# Patient Record
Sex: Female | Born: 1970 | Race: Black or African American | Hispanic: No | Marital: Single | Smoking: Never smoker
Health system: Southern US, Community
[De-identification: ages and names within clinical notes are randomized; demographics above are authoritative.]

---

## 2006-01-16 ENCOUNTER — Emergency Department: Payer: Self-pay | Admitting: General Practice

## 2006-01-21 ENCOUNTER — Emergency Department: Payer: Self-pay | Admitting: Emergency Medicine

## 2007-03-31 ENCOUNTER — Emergency Department: Payer: Self-pay | Admitting: Emergency Medicine

## 2009-04-02 ENCOUNTER — Emergency Department: Payer: Self-pay | Admitting: Emergency Medicine

## 2009-08-02 ENCOUNTER — Emergency Department: Payer: Self-pay | Admitting: Emergency Medicine

## 2009-09-15 ENCOUNTER — Emergency Department: Payer: Self-pay | Admitting: Internal Medicine

## 2010-01-12 ENCOUNTER — Emergency Department: Payer: Self-pay | Admitting: Emergency Medicine

## 2010-01-20 ENCOUNTER — Emergency Department: Payer: Self-pay | Admitting: Emergency Medicine

## 2010-07-05 ENCOUNTER — Emergency Department: Payer: Self-pay | Admitting: Emergency Medicine

## 2011-03-11 ENCOUNTER — Emergency Department: Payer: Self-pay | Admitting: Internal Medicine

## 2011-04-16 ENCOUNTER — Emergency Department: Payer: Self-pay | Admitting: Internal Medicine

## 2011-08-30 ENCOUNTER — Emergency Department: Payer: Self-pay | Admitting: Emergency Medicine

## 2012-01-01 ENCOUNTER — Inpatient Hospital Stay: Payer: Self-pay | Admitting: Obstetrics & Gynecology

## 2012-06-30 ENCOUNTER — Emergency Department: Payer: Self-pay | Admitting: Emergency Medicine

## 2012-06-30 LAB — URINALYSIS, COMPLETE
Bilirubin,UR: NEGATIVE
Blood: NEGATIVE
Glucose,UR: NEGATIVE mg/dL (ref 0–75)
Ketone: NEGATIVE
Leukocyte Esterase: NEGATIVE
Nitrite: NEGATIVE
Ph: 6 (ref 4.5–8.0)
Squamous Epithelial: <1
WBC UR: 1 /HPF (ref 0–5)

## 2012-06-30 LAB — WET PREP, GENITAL

## 2012-08-18 ENCOUNTER — Emergency Department: Payer: Self-pay | Admitting: Emergency Medicine

## 2012-08-18 LAB — URINALYSIS, COMPLETE
Bacteria: NONE SEEN
Bilirubin,UR: NEGATIVE
Nitrite: NEGATIVE
Protein: NEGATIVE
RBC,UR: 1 /HPF (ref 0–5)
WBC UR: 1 /HPF (ref 0–5)

## 2012-08-18 LAB — COMPREHENSIVE METABOLIC PANEL
Albumin: 4.5 g/dL (ref 3.4–5.0)
Alkaline Phosphatase: 74 U/L (ref 50–136)
Anion Gap: 7 (ref 7–16)
BUN: 8 mg/dL (ref 7–18)
Calcium, Total: 9.6 mg/dL (ref 8.5–10.1)
Chloride: 106 mmol/L (ref 98–107)
Co2: 27 mmol/L (ref 21–32)
Creatinine: 0.9 mg/dL (ref 0.60–1.30)
EGFR (African American): >60
EGFR (Non-African Amer.): >60
Osmolality: 277 (ref 275–301)
Potassium: 3.7 mmol/L (ref 3.5–5.1)
SGOT(AST): 24 U/L (ref 15–37)
Sodium: 140 mmol/L (ref 136–145)
Total Protein: 9 g/dL — ABNORMAL HIGH (ref 6.4–8.2)

## 2012-08-18 LAB — CBC
HCT: 37.8 % (ref 35.0–47.0)
MCV: 99 fL (ref 80–100)
WBC: 3.8 10*3/uL (ref 3.6–11.0)

## 2012-08-18 LAB — WET PREP, GENITAL

## 2013-01-21 ENCOUNTER — Emergency Department: Payer: Self-pay | Admitting: Emergency Medicine

## 2013-01-21 LAB — WET PREP, GENITAL

## 2013-01-21 LAB — GC/CHLAMYDIA PROBE AMP

## 2013-11-11 IMAGING — CT CT HEAD WITHOUT CONTRAST
2 series · 16 of 30 positions shown, 20 images · non-contrast
Comparison: none

REASON FOR EXAM: abrupt onset of worse HA of life
COMMENTS:   LMP: 2 days ago

PROCEDURE:     CT  - CT HEAD WITHOUT CONTRAST  - April 16, 2011  [DATE]
RESULT:     Comparison:  None
TECHNIQUE: Multiple axial images from the foramen magnum to the vertex were
obtained without IV contrast.

[Series 2: without · axial · non-contrast · 0.37mm/px · z∈[+438,+558]mm · 13 of 29 slices shown, 17 images]
[im 3/29  brain]
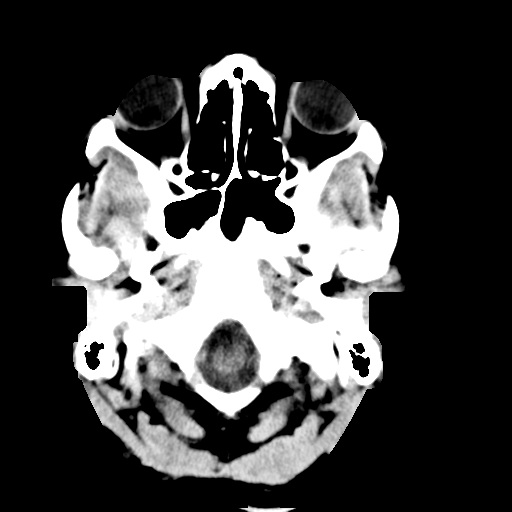
[im 3/29  bone]
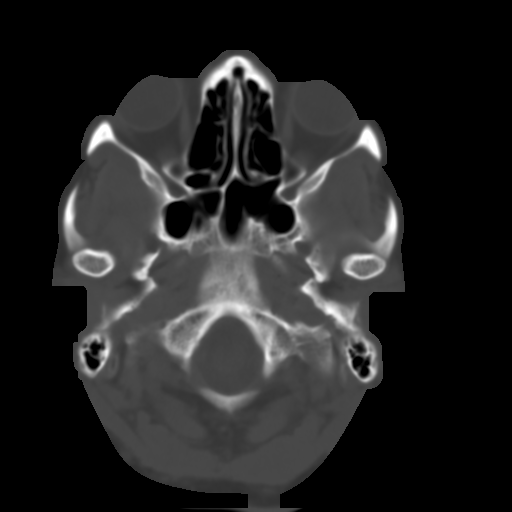
[im 5/29  brain]
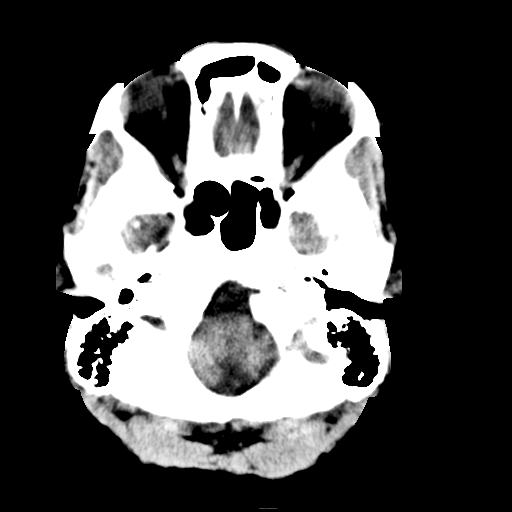
[im 7/29  brain]
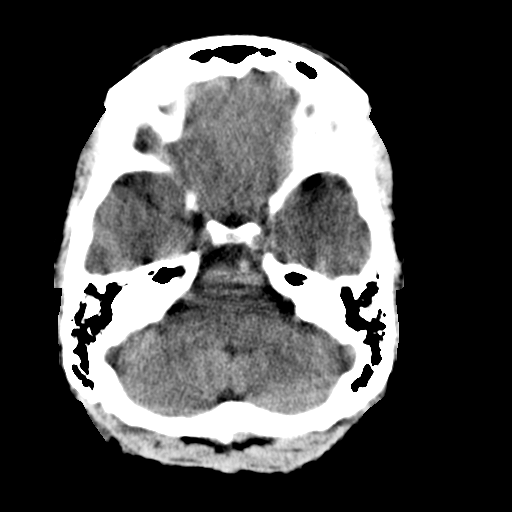
[im 9/29  brain]
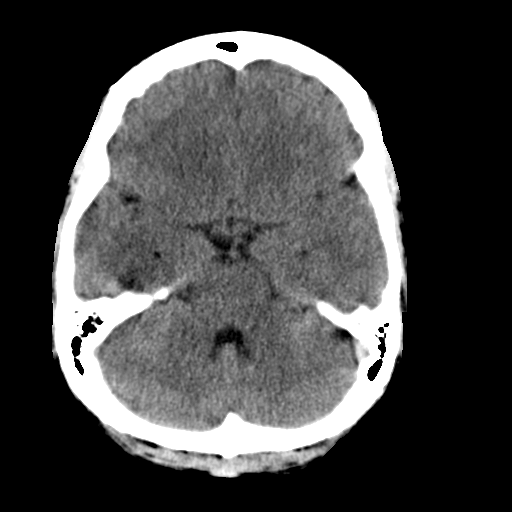
[im 11/29  brain]
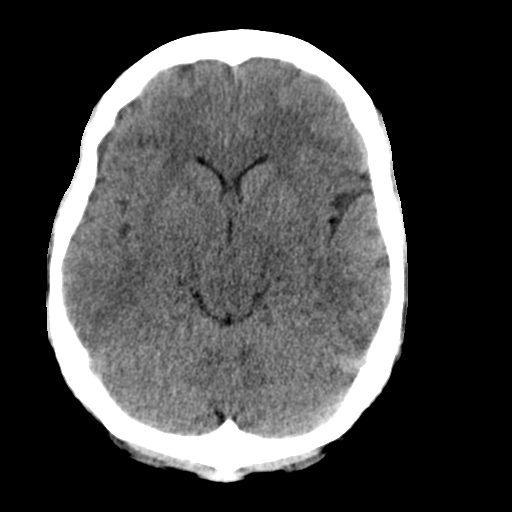
[im 11/29  bone]
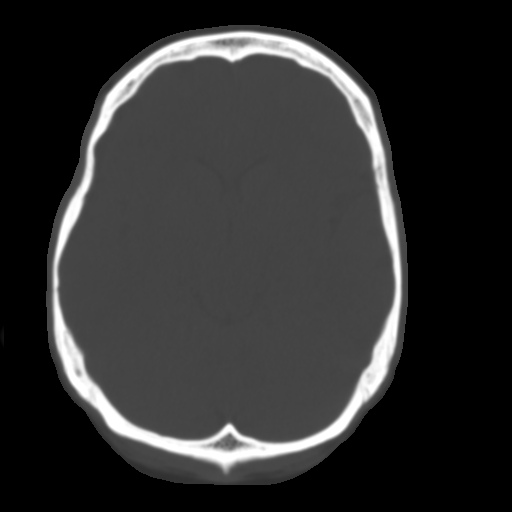
[im 13/29  brain]
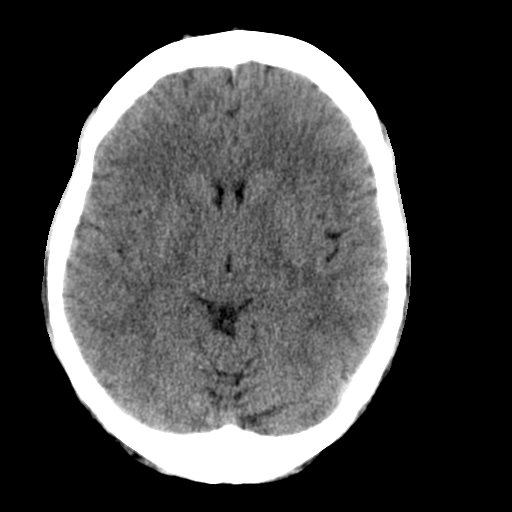
[im 15/29  brain]
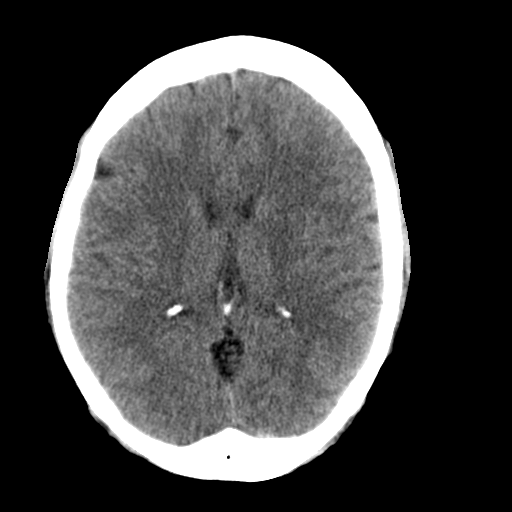
[im 17/29  brain]
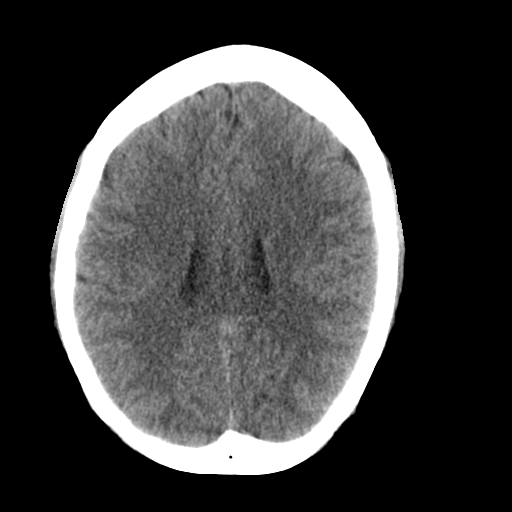
[im 19/29  brain]
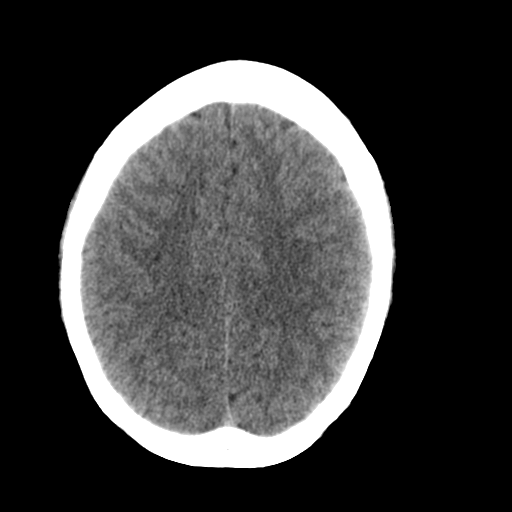
[im 19/29  bone]
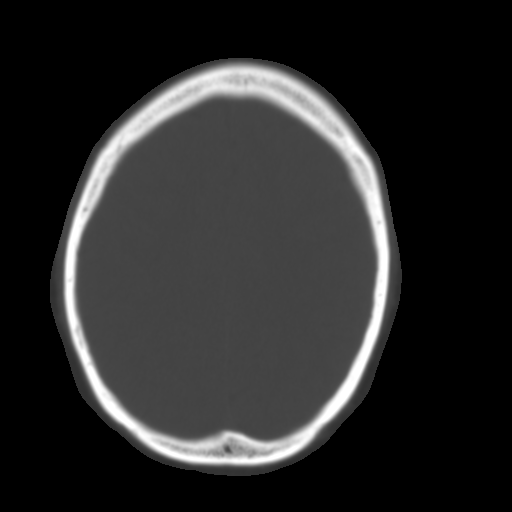
[im 21/29  brain]
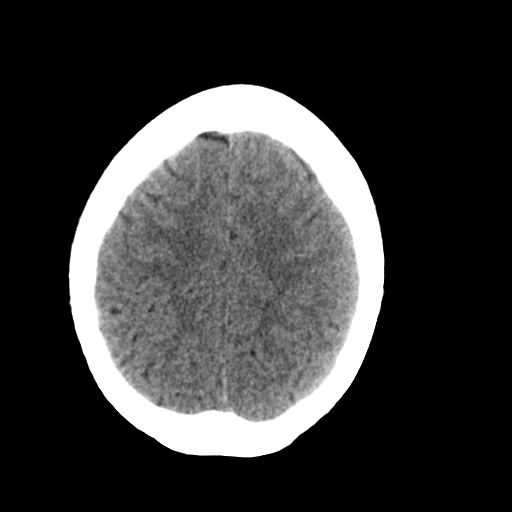
[im 23/29  brain]
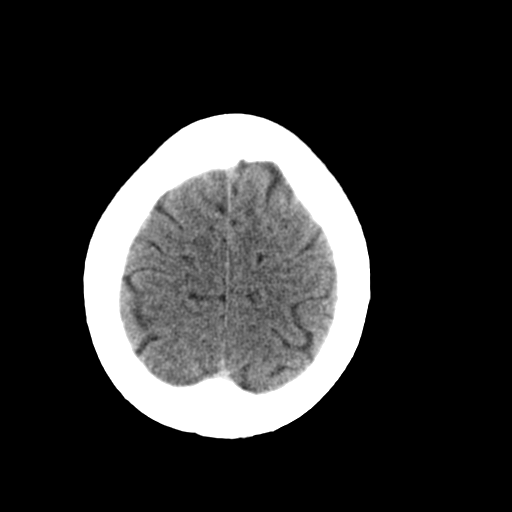
[im 25/29  brain]
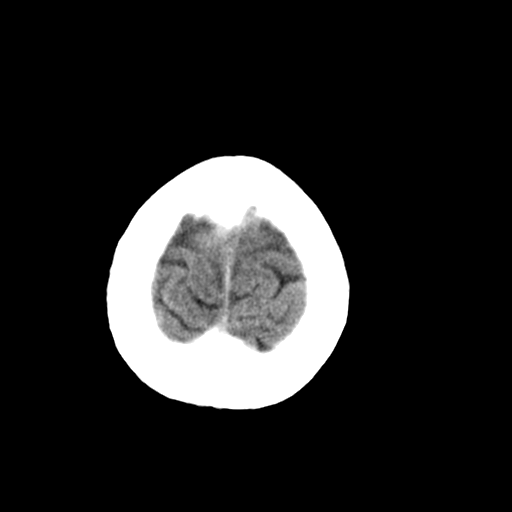
[im 27/29  brain]
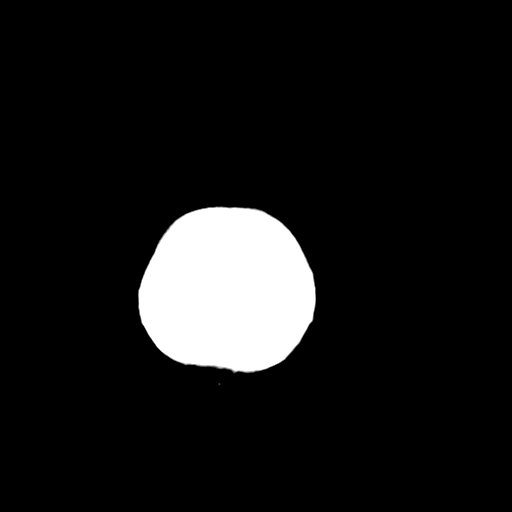
[im 27/29  bone]
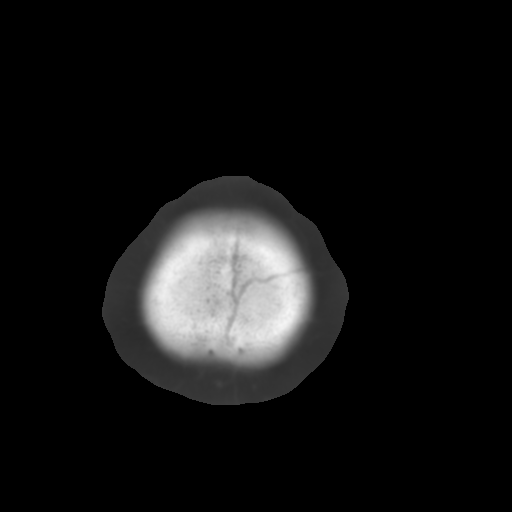

[Series 3: bone · axial · 0.37mm/px · z∈[+438,+478]mm · 3 of 29 slices shown]
[im 3/29  bone]
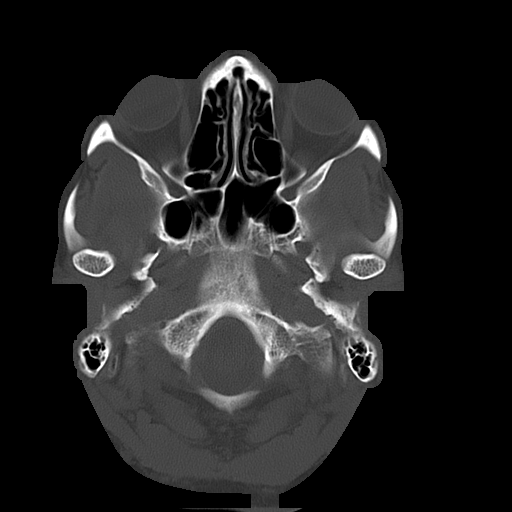
[im 7/29  bone]
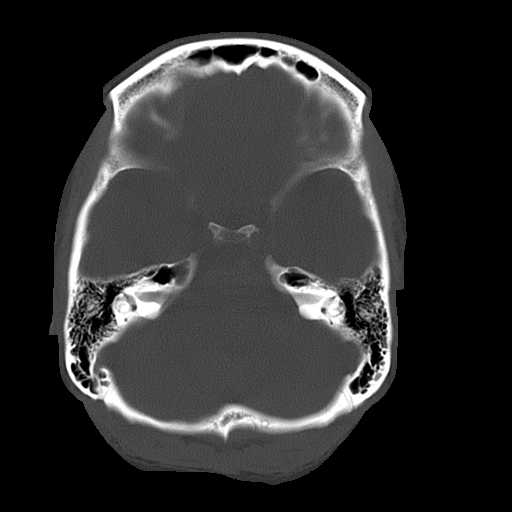
[im 11/29  bone]
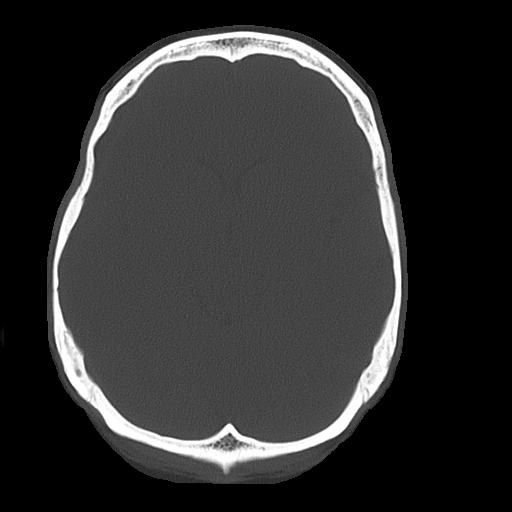

[16 of 30 positions shown; findings below may reference images not displayed]

FINDINGS: There is no evidence for mass effect, midline shift, or extra-axial fluid
collections. There is no evidence for space-occupying lesion, intracranial
hemorrhage, or cortical-based area of infarction.

The osseous structures are unremarkable.
IMPRESSION: No acute intracranial process.

## 2014-04-04 ENCOUNTER — Emergency Department: Payer: Self-pay | Admitting: Emergency Medicine

## 2014-08-12 NOTE — H&P (Signed)
L&D Evaluation:  History Expanded:   HPI 44 yo [id number], EDD of 02/27/12 per pt's report (records not available). Presents at 32 weeks with c/o leaking since yesterday am around 0100. Pt also reports some abdominal pain and spotting. [facility]. History notable for +HIV status and prior C-section x 3, most recent delivery was 12 years ago.    Blood Type (Maternal) Unknown    Group B Strep Results Maternal (Result >5wks must be treated as unknown) unknown/result > 5 weeks ago    Maternal HIV Positive    Maternal Syphilis Ab Unknown    Maternal Varicella Unknown    Rubella Results (Maternal) unknown    Maternal T-Dap Unknown    Patient's Medical History Asthma  +HIV    Patient's Surgical History Previous C-Section  x3    Medications Pre Natal Vitamins  HIV medications     Allergies PCN    Social History none     Family History Non-Contributory    ROS:   ROS see HPI   Exam:   Vital Signs stable     General no apparent distress    Mental Status clear    Chest clear     Heart no murmur/gallop/rubs    Abdomen gravid, tender with contractions    Edema no edema    Pelvic no external lesions, 2/75/-2    Mebranes Ruptured, equivocal nitrizine, +pooling, + fern    Description clear    FHT normal rate with no decels, appropriate for gestational age    Ucx irregular    Skin dry   Impression:   Impression PPROM, + HIV   Plan:   Comments Discussed with Dr Tiburcio PeaHarris, given +HIV status, [facility] Will start IV and [facility].   Electronic Signatures: Angelisa Winthrop, Marta Lamasamara K (CNM)  (Signed 29-Sep-13 12:15)  Authored: L&D Evaluation   Last Updated: 29-Sep-13 12:15 by Vella KohlerBrothers, Adama Ivins K (CNM)

## 2019-11-24 ENCOUNTER — Emergency Department
Admission: EM | Admit: 2019-11-24 | Discharge: 2019-11-24 | Disposition: A | Discharge status: Home / Self Care | Payer: Medicaid Other | Attending: Emergency Medicine | Admitting: Emergency Medicine

## 2019-11-24 ENCOUNTER — Other Ambulatory Visit: Payer: Self-pay

## 2019-11-24 DIAGNOSIS — M546 Pain in thoracic spine: Secondary | ICD-10-CM

## 2019-11-24 DIAGNOSIS — R109 Unspecified abdominal pain: Secondary | ICD-10-CM | POA: Diagnosis present

## 2019-11-24 LAB — URINALYSIS, COMPLETE (UACMP) WITH MICROSCOPIC
Bacteria, UA: NONE SEEN
Bilirubin Urine: NEGATIVE
Glucose, UA: NEGATIVE mg/dL
Hgb urine dipstick: NEGATIVE
Ketones, ur: NEGATIVE mg/dL
Leukocytes,Ua: NEGATIVE
Nitrite: NEGATIVE
Protein, ur: NEGATIVE mg/dL
Specific Gravity, Urine: 1.03 (ref 1.005–1.030)
pH: 5 (ref 5.0–8.0)

## 2019-11-24 NOTE — ED Provider Notes (Signed)
Emergency Department Provider Note  ____________________________________________  Time seen: Approximately 4:48 PM  I have reviewed the triage vital signs and the nursing notes.   HISTORY  Chief Complaint Back Pain   Historian Patietn    HPI Dana Clayton is a 49 y.o. female presents to the emergency department after patient felt an intense, burning pain along the left flank.  Patient states the pain lasted several seconds and then resolved.  Patient states that pain has not returned.  Patient states that she has not experienced similar pain in the past and wanted to be evaluated. She denies falls or mechanisms of trauma.  No dysuria, hematuria or increased urinary frequency.  She has been afebrile at home.  She has not noticed a rash on the left flank.  She denies cough or recent illness she denies chest pain, chest tightness, shortness of breath or abdominal pain.   History reviewed. No pertinent past medical history.   Immunizations up to date:  Yes.     History reviewed. No pertinent past medical history.  There are no problems to display for this patient.    Prior to Admission medications   Not on File    Allergies Penicillins  History reviewed. No pertinent family history.  Social History Social History   Tobacco Use  . Smoking status: Not on file  Substance Use Topics  . Alcohol use: Not on file  . Drug use: Not on file     Review of Systems  Constitutional: No fever/chills Eyes:  No discharge ENT: No upper respiratory complaints. Respiratory: no cough. No SOB/ use of accessory muscles to breath Gastrointestinal:   No nausea, no vomiting.  No diarrhea.  No constipation. Musculoskeletal: Negative for musculoskeletal pain. Skin: Negative for rash, abrasions, lacerations, ecchymosis.    ____________________________________________   PHYSICAL EXAM:  VITAL SIGNS: ED Triage Vitals  Enc Vitals Group     BP 11/24/19 1429 (!) 149/85      Pulse Rate 11/24/19 1429 81     Resp 11/24/19 1429 18     Temp 11/24/19 1429 98.7 F (37.1 C)     Temp Source 11/24/19 1429 Oral     SpO2 11/24/19 1429 99 %     Weight 11/24/19 1430 191 lb (86.6 kg)     Height 11/24/19 1430 5\' 3"  (1.6 m)     Head Circumference --      Peak Flow --      Pain Score 11/24/19 1430 7     Pain Loc --      Pain Edu? --      Excl. in GC? --      Constitutional: Alert and oriented. Well appearing and in no acute distress. Eyes: Conjunctivae are normal. PERRL. EOMI. Head: Atraumatic. ENT:      Ears:       Nose: No congestion/rhinnorhea.      Mouth/Throat: Mucous membranes are moist.  Neck: No stridor.  No cervical spine tenderness to palpation.  Cardiovascular: Normal rate, regular rhythm. Normal S1 and S2.  Good peripheral circulation. Respiratory: Normal respiratory effort without tachypnea or retractions. Lungs CTAB. Good air entry to the bases with no decreased or absent breath sounds Gastrointestinal: Bowel sounds x 4 quadrants. Soft and nontender to palpation. No guarding or rigidity. No distention. Musculoskeletal: Full range of motion to all extremities. No obvious deformities noted Neurologic:  Normal for age. No gross focal neurologic deficits are appreciated.  Skin:  Skin is warm, dry and intact. No rash  noted. Psychiatric: Mood and affect are normal for age. Speech and behavior are normal.   ____________________________________________   LABS (all labs ordered are listed, but only abnormal results are displayed)  Labs Reviewed  URINALYSIS, COMPLETE (UACMP) WITH MICROSCOPIC - Abnormal; Notable for the following components:      Result Value   Color, Urine YELLOW (*)    APPearance CLEAR (*)    All other components within normal limits   ____________________________________________  EKG   ____________________________________________  RADIOLOGY   No results  found.  ____________________________________________    PROCEDURES  Procedure(s) performed:     Procedures     Medications - No data to display   ____________________________________________   INITIAL IMPRESSION / ASSESSMENT AND PLAN / ED COURSE  Pertinent labs & imaging results that were available during my care of the patient were reviewed by me and considered in my medical decision making (see chart for details).    Assessment and plan Flank pain 49 year old female presents to the emergency department with a intense burning flank pain that occurred momentarily and then resolved.  Patient was mildly hypertensive at triage but vital signs were otherwise reassuring.  On physical exam, patient had no paraspinal muscle tenderness that was reproduced and no CVA tenderness.  We will obtain urinalysis and will reassess.  Patient has declined pain medicine in the emergency department stating that she is currently completely asymptomatic at this time.  Urinalysis was reassuring without signs to indicate nephrolithiasis, pyelonephritis or cystitis.  Patient remains comfortable and states that she has not experienced pain while in the emergency department.  She is comfortable being discharged without prescribed pain medicines.   ____________________________________________  FINAL CLINICAL IMPRESSION(S) / ED DIAGNOSES  Final diagnoses:  Acute thoracic back pain, unspecified back pain laterality      NEW MEDICATIONS STARTED DURING THIS VISIT:  ED Discharge Orders    None          This chart was dictated using voice recognition software/Dragon. Despite best efforts to proofread, errors can occur which can change the meaning. Any change was purely unintentional.     Gasper Lloyd 11/24/19 Toma Deiters, MD 11/24/19 2218

## 2019-11-24 NOTE — ED Notes (Signed)
Patient left before discharge instructions could be discussed or vital signs taken.

## 2019-11-24 NOTE — ED Triage Notes (Signed)
Pt comes POV with pinching feeling in left side of middle back area. Pt was cooking when it started. Unsure if injury.

## 2019-12-15 ENCOUNTER — Emergency Department
Admission: EM | Admit: 2019-12-15 | Discharge: 2019-12-15 | Disposition: A | Discharge status: Home / Self Care | Payer: Medicaid Other | Attending: Emergency Medicine | Admitting: Emergency Medicine

## 2019-12-15 ENCOUNTER — Other Ambulatory Visit: Payer: Self-pay

## 2019-12-15 DIAGNOSIS — L509 Urticaria, unspecified: Secondary | ICD-10-CM | POA: Diagnosis present

## 2019-12-15 DIAGNOSIS — L508 Other urticaria: Secondary | ICD-10-CM

## 2019-12-15 MED ORDER — HYDROXYZINE PAMOATE 25 MG PO CAPS: 25.0000 mg | ORAL_CAPSULE | Freq: Three times a day (TID) | ORAL | 0 refills | Status: AC | PRN

## 2019-12-15 MED ORDER — FAMOTIDINE 20 MG PO TABS
40.0000 mg | ORAL_TABLET | Freq: Once | ORAL | Status: AC
  Administered 2019-12-15: 40 mg via ORAL
  Filled 2019-12-15: qty 2

## 2019-12-15 MED ORDER — FAMOTIDINE 20 MG PO TABS: 20.0000 mg | ORAL_TABLET | Freq: Two times a day (BID) | ORAL | 0 refills | Status: DC

## 2019-12-15 MED ORDER — PREDNISONE 20 MG PO TABS: ORAL_TABLET | ORAL | 0 refills | Status: DC

## 2019-12-15 MED ORDER — DEXAMETHASONE SODIUM PHOSPHATE 10 MG/ML IJ SOLN
10.0000 mg | Freq: Once | INTRAMUSCULAR | Status: AC
  Administered 2019-12-15: 10 mg via INTRAMUSCULAR
  Filled 2019-12-15: qty 1

## 2019-12-15 NOTE — ED Triage Notes (Signed)
Pt to the er for hives over her body. Pt states it started 2 days ago. Pt taking no meds at home to treat. Pt only allergy is to PCN. Pt denies itching. Pt denies any new environmental exposures. No SOB.

## 2019-12-15 NOTE — Discharge Instructions (Addendum)
You are being treated for hives. The exact cause is not known.  Take the prescription meds as prescribed, and follow-up with your primary provider for continued symptoms.  Avoid excessive sweating and overheating.  Also avoid long hot showers.  Return to the ED if necessary.

## 2019-12-15 NOTE — ED Provider Notes (Signed)
[facility] Emergency Department Provider Note ____________________________________________  Time seen: 1625  I have reviewed the triage vital signs and the nursing notes.  HISTORY  Chief Complaint  Urticaria  HPI Dana Clayton is a 49 y.o. female presents herself to the ED for evaluation of  2 days of hives over the body.  Patient denies taking any new medications or any medications to treat her symptoms in the interim.  She only notes an allergy to penicillin.  She denies any difficulty breathing, swallowing, or controlling secretions.  She also denies any known or new environmental exposures.  She denies any cough, congestion, or shortness of breath.  History reviewed. No pertinent past medical history.  There are no problems to display for this patient.  History reviewed. No pertinent surgical history.  Prior to Admission medications   Medication Sig Start Date End Date Taking? Authorizing Provider  famotidine (PEPCID) 20 MG tablet Take 1 tablet (20 mg total) by mouth 2 (two) times daily for 7 days. 12/15/19 12/22/19  Maryclaire Stoecker, Charlesetta Ivory, PA-C  hydrOXYzine (VISTARIL) 25 MG capsule Take 1 capsule (25 mg total) by mouth 3 (three) times daily as needed for up to 10 days for itching. 12/15/19 12/25/19  Edmund Rick, Charlesetta Ivory, PA-C  predniSONE (DELTASONE) 20 MG tablet Take 3 tabs daily x 2 days; Take 2 tabs daily x 2 days; Take 1 tab daily x 2 days; Take 0.5 tabs daily x 2 days 12/15/19   Michale Weikel, Charlesetta Ivory, PA-C    Allergies Penicillins  History reviewed. No pertinent family history.  Social History Social History   Tobacco Use  . Smoking status: Never Smoker  . Smokeless tobacco: Never Used  Substance Use Topics  . Alcohol use: Yes    Comment: rarely  . Drug use: Never    Review of Systems  Constitutional: Negative for fever. Eyes: Negative for visual changes. ENT: Negative for sore throat. Denies mucous membrane swelling Cardiovascular:  Negative for chest pain. Respiratory: Negative for shortness of breath. Gastrointestinal: Negative for abdominal pain, vomiting and diarrhea. Genitourinary: Negative for dysuria. Skin: Positive for rash. Neurological: Negative for headaches, focal weakness or numbness. ____________________________________________  PHYSICAL EXAM:  VITAL SIGNS: ED Triage Vitals  Enc Vitals Group     BP 12/15/19 1357 (!) 126/107     Pulse Rate 12/15/19 1357 (!) 113     Resp 12/15/19 1357 18     Temp 12/15/19 1357 99.2 F (37.3 C)     Temp Source 12/15/19 1357 Oral     SpO2 12/15/19 1357 96 %     Weight 12/15/19 1358 191 lb (86.6 kg)     Height 12/15/19 1358 5\' 3"  (1.6 m)     Head Circumference --      Peak Flow --      Pain Score 12/15/19 1358 0     Pain Loc --      Pain Edu? --      Excl. in GC? --     Constitutional: Alert and oriented. Well appearing and in no distress. Head: Normocephalic and atraumatic. Eyes: Conjunctivae are normal. PERRL. Normal extraocular movements Ears: Canals clear. TMs intact bilaterally. Nose: No congestion/rhinorrhea/epistaxis. Mouth/Throat: Mucous membranes are moist.  Uvula is midline and tonsils are flat.  No oropharyngeal lesions are appreciated. Neck: Supple. No thyromegaly. Hematological/Lymphatic/Immunological: No cervical lymphadenopathy. Cardiovascular: Normal rate, regular rhythm. Normal distal pulses. Respiratory: Normal respiratory effort. No wheezes/rales/rhonchi. Musculoskeletal: Nontender with normal range of motion in all extremities.  Neurologic:  Normal gait without ataxia. Normal speech and language. No gross focal neurologic deficits are appreciated. Skin:  Skin is warm, dry and intact.  Patient with multiple scattered erythematous maculopapular lesions noted to the extremities and torso.  The whelps areas are consistent with urticaria.  No vesicular eruptions, induration, or excoriations are  noted. ____________________________________________  PROCEDURES  Decadron 10 mg IM Famotidine 40 mg p.o.  Procedures ____________________________________________  INITIAL IMPRESSION / ASSESSMENT AND PLAN / ED COURSE  DDX: contact dermatitis, drug rash, zoster, PR  Patient with ED evaluation of 2-day complaint of waxing and waning pruritic whelps to the extremities and torso.  Patient is unclear of any known triggers or contacts.  She also denies any difficulty breathing, swallowing, or controlling secretions.  She is treated empirically for an idiopathic urticaria with steroids and antihistamines in the ED.  Patient is discharged with symptoms slightly improved and with prescriptions to continue with histamine blockade and a steroid taper.  She is advised to follow-up with her primary provider should symptoms persist.  Return precautions have been discussed.  Dana Clayton was evaluated in Emergency Department on 12/15/2019 for the symptoms described in the history of present illness. She was evaluated in the context of the global COVID-19 pandemic, which necessitated consideration that the patient might be at risk for infection with the SARS-CoV-2 virus that causes COVID-19. Institutional protocols and algorithms that pertain to the evaluation of patients at risk for COVID-19 are in a state of rapid change based on information released by regulatory bodies including the CDC and federal and state organizations. These policies and algorithms were followed during the patient's care in the ED. ____________________________________________  FINAL CLINICAL IMPRESSION(S) / ED DIAGNOSES  Final diagnoses:  Urticaria, acute      Karmen Stabs, Charlesetta Ivory, PA-C 12/15/19 1752    Shaune Pollack, MD 12/20/19 [phone]

## 2022-05-04 ENCOUNTER — Encounter (HOSPITAL_COMMUNITY): Payer: Self-pay

## 2022-05-04 ENCOUNTER — Other Ambulatory Visit: Payer: Self-pay

## 2022-05-04 ENCOUNTER — Emergency Department (HOSPITAL_COMMUNITY)
Admission: EM | Admit: 2022-05-04 | Discharge: 2022-05-04 | Disposition: A | Discharge status: Home / Self Care | Payer: Medicaid Other | Attending: Emergency Medicine | Admitting: Emergency Medicine

## 2022-05-04 DIAGNOSIS — R2232 Localized swelling, mass and lump, left upper limb: Secondary | ICD-10-CM | POA: Insufficient documentation

## 2022-05-04 MED ORDER — CEPHALEXIN 500 MG PO CAPS: 500.0000 mg | ORAL_CAPSULE | Freq: Four times a day (QID) | ORAL | 0 refills | Status: AC

## 2022-05-04 MED ORDER — CEPHALEXIN 500 MG PO CAPS: 500.0000 mg | ORAL_CAPSULE | Freq: Four times a day (QID) | ORAL | 0 refills | Status: DC

## 2022-05-04 MED ORDER — IBUPROFEN 400 MG PO TABS
600.0000 mg | ORAL_TABLET | Freq: Once | ORAL | Status: AC
  Administered 2022-05-04: 600 mg via ORAL
  Filled 2022-05-04: qty 1

## 2022-05-04 NOTE — ED Provider Notes (Signed)
[facility] Provider Note   CSN: [id number] Arrival date & time: 05/04/22  7893     History Chief Complaint  Patient presents with   Arm Pain    Dana Clayton is a 52 y.o. female patient who presents to the emergency department today for further evaluation of a painful swollen knot to the left forearm has been present for years but has been increasingly painful over the last week or so.  Patient states that she has had this not for several years but has not been as big as it has been in the last week.  He has no painful and is waking her up from sleep.  She denies any fever, chills, weight loss.   Arm Pain       Home Medications Prior to Admission medications   Medication Sig Start Date End Date Taking? Authorizing Provider  cephALEXin (KEFLEX) 500 MG capsule Take 1 capsule (500 mg total) by mouth 4 (four) times daily. 05/04/22  Yes Raul Del, Khalea Ventura M, PA-C  famotidine (PEPCID) 20 MG tablet Take 1 tablet (20 mg total) by mouth 2 (two) times daily for 7 days. 12/15/19 12/22/19  Menshew, Dannielle Karvonen, PA-C  predniSONE (DELTASONE) 20 MG tablet Take 3 tabs daily x 2 days; Take 2 tabs daily x 2 days; Take 1 tab daily x 2 days; Take 0.5 tabs daily x 2 days 12/15/19   Menshew, Dannielle Karvonen, PA-C      Allergies    Penicillins    Review of Systems   Review of Systems  All other systems reviewed and are negative.   Physical Exam Updated Vital Signs BP (!) 157/85 (BP Location: Right Arm)   Pulse 64   Temp 98.6 F (37 C) (Oral)   Resp 17   Ht 5\' 3"  (1.6 m)   Wt 86.2 kg   SpO2 100%   BMI 33.66 kg/m  Physical Exam Vitals and nursing note reviewed.  Constitutional:      Appearance: Normal appearance.  HENT:     Head: Normocephalic and atraumatic.  Eyes:     General:        Right eye: No discharge.        Left eye: No discharge.     Conjunctiva/sclera: Conjunctivae normal.  Pulmonary:     Effort: Pulmonary effort is normal.   Musculoskeletal:     Comments: 2+ radial pulses bilaterally.  Range of motion of the left wrist does exacerbate pain in the left forearm at the area of the knot.  Skin:    General: Skin is warm and dry.     Findings: No rash.     Comments: There is a tender swollen area over the anterior aspect of the left forearm medially.  Area is not warm to palpation.  No obvious fluctuance.    Neurological:     General: No focal deficit present.     Mental Status: She is alert.  Psychiatric:        Mood and Affect: Mood normal.        Behavior: Behavior normal.     ED Results / Procedures / Treatments   Labs (all labs ordered are listed, but only abnormal results are displayed) Labs Reviewed - No data to display  EKG None  Radiology No results found.  Procedures Ultrasound ED Soft Tissue  Date/Time: 05/04/2022 10:27 AM  Performed by: Hendricks Limes, PA-C Authorized by: Hendricks Limes, PA-C  Procedure details:    Indications: localization of abscess and evaluate for cellulitis     Transverse view:  Visualized   Longitudinal view:  Visualized   Images: not archived   Location:    Location: upper extremity     Side:  Left Findings:     no abscess present    no cellulitis present Comments:     Area was hypoechoic with some evidence of cobblestoning superficially.  Within the hypoechoic area there was also some evidence of cobblestoning.  Area is deep.     Medications Ordered in ED Medications  ibuprofen (ADVIL) tablet 600 mg (has no administration in time range)    ED Course/ Medical Decision Making/ A&P   {   Click here for ABCD2, HEART and other calculators                          Medical Decision Making Dana Clayton is a 52 y.o. female patient who presents to the emergency department today for further evaluation of a tender nodule to the left anterior medial forearm.  I visualized the area with ultrasound and area appears to be deep there is no obvious  fluctuance visualized.  Given the depth and surrounding structures I do not feel comfortable attempting an incision and drainage as there is likely surrounding ligamentous and tendinous structures.  I will place patient on a brief course of antibiotics and have her follow-up with her primary care doctor for further evaluation if this does not get better.  Patient agreeable with this plan.  Strict return precautions were discussed.  She is safe for discharge.    Final Clinical Impression(s) / ED Diagnoses Final diagnoses:  Nodule of skin of left forearm    Rx / DC Orders ED Discharge Orders          Ordered    cephALEXin (KEFLEX) 500 MG capsule  4 times daily        05/04/22 [address] [place], [place] 05/04/22 1031    Blanchie Dessert, MD 05/04/22 1449

## 2022-05-04 NOTE — ED Triage Notes (Signed)
Pt arrived POV from home c/o having a lump on her left forearm area. Pt states it has been there for 4 years so she thought nothing of it but then a couple days ago it started hurting. Pt states it is causing a burning sensation in her arm, that it is tender to touch and it causes her arm to go numb.

## 2022-05-04 NOTE — Discharge Instructions (Signed)
Please take Keflex as prescribed.  As we discussed, I would like for you to follow-up with your primary care doctor after taking all of the antibiotics if this does not get better.  You may return to the emergency room for any worsening symptoms including if the area becomes more painful, more swollen, or more red or begins draining.

## 2022-05-15 ENCOUNTER — Emergency Department (HOSPITAL_COMMUNITY)
Admission: EM | Admit: 2022-05-15 | Discharge: 2022-05-15 | Disposition: A | Discharge status: Home / Self Care | Payer: Medicaid Other | Attending: Emergency Medicine | Admitting: Emergency Medicine

## 2022-05-15 DIAGNOSIS — L509 Urticaria, unspecified: Secondary | ICD-10-CM | POA: Insufficient documentation

## 2022-05-15 DIAGNOSIS — R21 Rash and other nonspecific skin eruption: Secondary | ICD-10-CM | POA: Diagnosis present

## 2022-05-15 MED ORDER — HYDROXYZINE HCL 25 MG PO TABS: 25.0000 mg | ORAL_TABLET | Freq: Four times a day (QID) | ORAL | 0 refills | Status: AC

## 2022-05-15 MED ORDER — DIPHENHYDRAMINE HCL 25 MG PO CAPS
50.0000 mg | ORAL_CAPSULE | Freq: Once | ORAL | Status: AC
  Administered 2022-05-15: 50 mg via ORAL
  Filled 2022-05-15: qty 2

## 2022-05-15 MED ORDER — FAMOTIDINE 20 MG PO TABS: 20.0000 mg | ORAL_TABLET | Freq: Every day | ORAL | 0 refills | Status: AC

## 2022-05-15 MED ORDER — PREDNISONE 10 MG PO TABS: ORAL_TABLET | ORAL | 0 refills | Status: AC

## 2022-05-15 MED ORDER — PREDNISONE 20 MG PO TABS
60.0000 mg | ORAL_TABLET | Freq: Once | ORAL | Status: AC
  Administered 2022-05-15: 60 mg via ORAL
  Filled 2022-05-15: qty 3

## 2022-05-15 NOTE — ED Provider Notes (Signed)
[facility] Provider Note   CSN: [id number] Arrival date & time: 05/15/22  1116     History  No chief complaint on file.   Dana Clayton is a 52 y.o. female, no pertinent past medical history, who presents to the ED secondary to rash on her bilateral arms and legs is been going on for the last day and a half.  She states that this happened a couple years ago and they never figured out why.  She states that she does not have any shortness of breath facial swelling, or swelling of the mouth.  Does state that it is a bit itchy, the rash, has not taken anything for it.  She notes that she has not tried any new foods, and that she does have a new detergent, but has been using it for the last 2 months.  Detergent is again.  Additionally she states the only new medication that she is on is Keflex, and that is been prescribed for the last 10 days, she was finishing her last dose, yesterday, only had 4 pills left, and developed a rash so she stopped taking it.  Reports she is also under a lot of stress right now.    Home Medications Prior to Admission medications   Medication Sig Start Date End Date Taking? Authorizing Provider  famotidine (PEPCID) 20 MG tablet Take 1 tablet (20 mg total) by mouth daily. 05/15/22  Yes Arie Powell L, PA  hydrOXYzine (ATARAX) 25 MG tablet Take 1 tablet (25 mg total) by mouth every 6 (six) hours. 05/15/22  Yes Tyshawn Ciullo L, PA  predniSONE (DELTASONE) 10 MG tablet Take 5 tablets (50 mg total) by mouth daily for 3 days, THEN 4 tablets (40 mg total) daily for 3 days, THEN 3 tablets (30 mg total) daily for 2 days, THEN 2 tablets (20 mg total) daily for 2 days, THEN 1 tablet (10 mg total) daily for 1 day. 05/15/22 05/26/22 Yes Sheikh Leverich L, PA  cephALEXin (KEFLEX) 500 MG capsule Take 1 capsule (500 mg total) by mouth 4 (four) times daily. 05/04/22   Hendricks Limes, PA-C      Allergies    Penicillins    Review of Systems    Review of Systems  Respiratory:  Negative for shortness of breath.   Skin:  Positive for rash.    Physical Exam Updated Vital Signs BP (!) 166/96 (BP Location: Right Arm)   Pulse 86   Temp 98.3 F (36.8 C)   Resp 13   SpO2 99%  Physical Exam Vitals and nursing note reviewed.  Constitutional:      General: She is not in acute distress.    Appearance: She is well-developed.  HENT:     Head: Normocephalic and atraumatic.  Eyes:     Conjunctiva/sclera: Conjunctivae normal.  Cardiovascular:     Rate and Rhythm: Normal rate and regular rhythm.     Heart sounds: No murmur heard. Pulmonary:     Effort: Pulmonary effort is normal. No respiratory distress.     Breath sounds: Normal breath sounds.  Abdominal:     Palpations: Abdomen is soft.     Tenderness: There is no abdominal tenderness.  Musculoskeletal:        General: No swelling.     Cervical back: Neck supple.  Skin:    General: Skin is warm and dry.     Capillary Refill: Capillary refill takes less than 2 seconds.  Comments: Erythematous wheals on bilateral lower extremities, bilateral upper extremities.  No mucosal lesions.  Neurological:     Mental Status: She is alert.  Psychiatric:        Mood and Affect: Mood normal.     ED Results / Procedures / Treatments   Labs (all labs ordered are listed, but only abnormal results are displayed) Labs Reviewed - No data to display  EKG None  Radiology No results found.  Procedures Procedures   Medications Ordered in ED Medications  predniSONE (DELTASONE) tablet 60 mg (60 mg Oral Given 05/15/22 1136)  diphenhydrAMINE (BENADRYL) capsule 50 mg (50 mg Oral Given 05/15/22 1136)    ED Course/ Medical Decision Making/ A&P                             Medical Decision Making Patient is a 52 year old female, here for rash on her bilateral arms and legs, she states that she has not tried any new foods, however has been on Keflex for the last 10 days, and was  supposed to take her last few doses yesterday.  She has no mucosal lesions, but she does have wheals on her bilateral upper and bilateral lower extremities.  They are nontender however they are still slightly itchy.  She was given prednisone, and Benadryl in the waiting room, with some improvement of her symptoms.  I am suspicious that this may be stress-induced urticaria versus allergic reaction to the Keflex.  I instructed her to stop the Keflex as of now, and to follow-up with her PCP.  If this recurrently occurs, she will likely need some labs, and allergy testing, however she has no oropharyngeal swelling, facial swelling, or shortness of breath at this time, thus we will treat her hydroxyzine, famotidine, and prednisone to help relieve her symptoms.  Return precautions emphasized.  Risk Prescription drug management.    Final Clinical Impression(s) / ED Diagnoses Final diagnoses:  Urticaria    Rx / DC Orders ED Discharge Orders          Ordered    predniSONE (DELTASONE) 10 MG tablet  Daily        05/15/22 1230    hydrOXYzine (ATARAX) 25 MG tablet  Every 6 hours        05/15/22 1230    famotidine (PEPCID) 20 MG tablet  Daily        05/15/22 1230              Charish Schroepfer L, PA 05/15/22 1234    Lennice Sites, DO 05/15/22 1248

## 2022-05-15 NOTE — Discharge Instructions (Addendum)
Please follow-up with your primary care doctor, as needed, please avoid any kind of new foods, and take the medications as prescribed.  I recommend taking the prednisone during the day, to help prevent any kind of difficulty sleeping.  You can take the hydroxyzine instead of Benadryl to help with the rash, to prevent sleepiness.  Additionally if you develop lesions in your mouth, worsening rash, fevers, abdominal pain please return to the ER.

## 2022-05-15 NOTE — ED Triage Notes (Signed)
Pt here from home with a rash to her arms and legs , nad

## 2022-05-15 NOTE — ED Notes (Signed)
Discharge instructions given to patient. Patient verbalized understanding.

## 2022-05-15 NOTE — ED Provider Triage Note (Signed)
Emergency Medicine Provider Triage Evaluation Note  TYNAE DREESSEN , a 52 y.o. female  was evaluated in triage.  Pt complains of rash on bilateral arms that started yesterday. Denies new food intake. Started using gain detergent 2 months ago. Finishing keflex script. Denies SOB, facial swelling  Review of Systems  Positive: rash Negative: Facial swelling  Physical Exam  BP (!) 166/96 (BP Location: Right Arm)   Pulse 86   Temp 98.3 F (36.8 C)   Resp 13   SpO2 99%  Gen:   Awake, no distress   Resp:  Normal effort  MSK:   Moves extremities without difficulty  Other:  Erythematous papules to bilateral arms  Medical Decision Making  Medically screening exam initiated at 11:33 AM.  Appropriate orders placed.  Miller L Wahls was informed that the remainder of the evaluation will be completed by another provider, this initial triage assessment does not replace that evaluation, and the importance of remaining in the ED until their evaluation is complete.    Osvaldo Shipper, [place] 05/15/22 1134

## 2022-06-15 NOTE — Progress Notes (Signed)
Erroneous encounter-disregard

## 2022-06-20 ENCOUNTER — Encounter: Payer: Medicaid Other | Admitting: Family

## 2022-06-20 DIAGNOSIS — Z7689 Persons encountering health services in other specified circumstances: Secondary | ICD-10-CM

## 2022-09-05 ENCOUNTER — Telehealth: Payer: Self-pay | Admitting: Family

## 2022-09-05 NOTE — Telephone Encounter (Signed)
Pt confirmed New patient appointment

## 2022-09-06 NOTE — Progress Notes (Unsigned)
  Subjective:    Dana Clayton - 52 y.o. female MRN [id number]  Date of birth: Jun 24, 1970  HPI  Dana Clayton is to establish care.   Current issues and/or concerns:  ROS per HPI     Health Maintenance:  Health Maintenance Due  Topic Date Due   COVID-19 Vaccine (1) Never done   HIV Screening  Never done   Hepatitis C Screening  Never done   DTaP/Tdap/Td (1 - Tdap) Never done   Zoster Vaccines- Shingrix (1 of 2) Never done   PAP SMEAR-Modifier  Never done   Colonoscopy  Never done   MAMMOGRAM  Never done     Past Medical History: There are no problems to display for this patient.     Social History   reports that she has never smoked. She has never used smokeless tobacco. She reports current alcohol use. She reports that she does not use drugs.   Family History  family history is not on file.   Medications: reviewed and updated   Objective:   Physical Exam There were no vitals taken for this visit. Physical Exam      Assessment & Plan:         Patient was given clear instructions to go to Emergency Department or return to medical center if symptoms don't improve, worsen, or new problems develop.The patient verbalized understanding.  I discussed the assessment and treatment plan with the patient. The patient was provided an opportunity to ask questions and all were answered. The patient agreed with the plan and demonstrated an understanding of the instructions.   The patient was advised to call back or seek an in-person evaluation if the symptoms worsen or if the condition fails to improve as anticipated.    Ricky Stabs, NP 09/06/2022, 11:30 AM Primary Care at [facility]

## 2022-09-07 ENCOUNTER — Encounter: Payer: Medicaid Other | Admitting: Family

## 2022-09-07 DIAGNOSIS — Z7689 Persons encountering health services in other specified circumstances: Secondary | ICD-10-CM
